# Patient Record
Sex: Female | Born: 1975 | Race: Black or African American | Hispanic: No | Marital: Married | Smoking: Former smoker
Health system: Southern US, Community
[De-identification: ages and names within clinical notes are randomized; demographics above are authoritative.]

## PROBLEM LIST (undated history)

## (undated) DIAGNOSIS — N971 Female infertility of tubal origin: Secondary | ICD-10-CM

## (undated) DIAGNOSIS — D250 Submucous leiomyoma of uterus: Secondary | ICD-10-CM

---

## 2015-05-02 ENCOUNTER — Encounter (HOSPITAL_BASED_OUTPATIENT_CLINIC_OR_DEPARTMENT_OTHER): Payer: Medicaid Other | Admitting: Clinical

## 2015-05-02 ENCOUNTER — Encounter: Payer: Self-pay | Admitting: Family Medicine

## 2015-05-02 ENCOUNTER — Ambulatory Visit: Payer: Medicaid Other | Attending: Family Medicine | Admitting: Family Medicine

## 2015-05-02 VITALS — BP 145/84 | HR 89 | Temp 99.0°F | Resp 16 | Ht 64.0 in | Wt 176.0 lb

## 2015-05-02 DIAGNOSIS — R030 Elevated blood-pressure reading, without diagnosis of hypertension: Secondary | ICD-10-CM | POA: Insufficient documentation

## 2015-05-02 DIAGNOSIS — M654 Radial styloid tenosynovitis [de Quervain]: Secondary | ICD-10-CM | POA: Diagnosis not present

## 2015-05-02 DIAGNOSIS — Z0000 Encounter for general adult medical examination without abnormal findings: Secondary | ICD-10-CM | POA: Diagnosis present

## 2015-05-02 DIAGNOSIS — Z658 Other specified problems related to psychosocial circumstances: Secondary | ICD-10-CM

## 2015-05-02 DIAGNOSIS — IMO0001 Reserved for inherently not codable concepts without codable children: Secondary | ICD-10-CM

## 2015-05-02 DIAGNOSIS — Z319 Encounter for procreative management, unspecified: Secondary | ICD-10-CM | POA: Diagnosis not present

## 2015-05-02 LAB — POCT URINALYSIS DIPSTICK
Bilirubin, UA: NEGATIVE
Blood, UA: NEGATIVE
Glucose, UA: NEGATIVE
Ketones, UA: NEGATIVE
LEUKOCYTES UA: NEGATIVE
Nitrite, UA: NEGATIVE
PH UA: 6
PROTEIN UA: NEGATIVE
SPEC GRAV UA: 1.025
UROBILINOGEN UA: 0.2

## 2015-05-02 LAB — POCT GLYCOSYLATED HEMOGLOBIN (HGB A1C): HEMOGLOBIN A1C: 5.6

## 2015-05-02 MED ORDER — FOLIC ACID 800 MCG PO TABS: 800.0000 ug | ORAL_TABLET | Freq: Every day | ORAL | Status: DC

## 2015-05-02 NOTE — Patient Instructions (Addendum)
Belinda Kaufman was seen today for annual exam.  Diagnoses and all orders for this visit:  Healthcare maintenance -     POCT urinalysis dipstick  Elevated BP -     Cancel: COMPLETE METABOLIC PANEL WITH GFR -     Cancel: CBC -     HgB A1c -     Cancel: Lipid Panel -     Cancel: TSH  De Quervain's tenosynovitis, right   I recommend pretreating with ibuprofen or aleve before a long work day. Take with food.   I will obtain labs results from [facility] and pap results from your previous PCP in [place].  F/u in 4 weeks for BP check with pharmacist  F/u with me in 3 months for BP check, sooner if needed   Dr. Bryson Corona Tenosynovitis Tendons attach muscles to bones. They also help with joint movements. When tendons become irritated or swollen, it is called tendinitis. The extensor pollicis brevis (EPB) tendon connects the EPB muscle to a bone that is near the base of the thumb. The EPB muscle helps to straighten and extend the thumb. De Quervain tenosynovitis is a condition in which the EPB tendon lining (sheath) becomes irritated, thickened, and swollen. This condition is sometimes called stenosing tenosynovitis. This condition causes pain on the thumb side of the back of the wrist. CAUSES Causes of this condition include:  Activities that repeatedly cause your thumb and wrist to extend.  A sudden increase in activity or change in activity that affects your wrist. RISK FACTORS: This condition is more likely to develop in:  Females.  People who have diabetes.  Women who have recently given birth.  People who are over 64 years of age.  People who do activities that involve repeated hand and wrist motions, such as tennis, racquetball, volleyball, gardening, and taking care of children.  People who do heavy labor.  People who have poor wrist strength and flexibility.  People who do not warm up properly before activities. SYMPTOMS Symptoms of this condition  include:  Pain or tenderness over the thumb side of the back of the wrist when your thumb and wrist are not moving.  Pain that gets worse when you straighten your thumb or extend your thumb or wrist.  Pain when the injured area is touched.  Locking or catching of the thumb joint while you bend and straighten your thumb.  Decreased thumb motion due to pain.  Swelling over the affected area. DIAGNOSIS This condition is diagnosed with a medical history and physical exam. Your health care provider will ask for details about your injury and ask about your symptoms. TREATMENT Treatment may include the use of icing and medicines to reduce pain and swelling. You may also be advised to wear a splint or brace to limit your thumb and wrist motion. In less severe cases, treatment may also include working with a physical therapist to strengthen your wrist and calm the irritation around your EPB tendon sheath. In severe cases, surgery may be needed. HOME CARE INSTRUCTIONS If You Have a Splint or Brace:  Wear it as told by your health care provider. Remove it only as told by your health care provider.  Loosen the splint or brace if your fingers become numb and tingle, or if they turn cold and blue.  Keep the splint or brace clean and dry. Managing Pain, Stiffness, and Swelling   If directed, apply ice to the injured area.  Put ice in a  plastic bag.  Place a towel between your skin and the bag.  Leave the ice on for 20 minutes, 2-3 times per day.  Move your fingers often to avoid stiffness and to lessen swelling.  Raise (elevate) the injured area above the level of your heart while you are sitting or lying down. General Instructions  Return to your normal activities as told by your health care provider. Ask your health care provider what activities are safe for you.  Take over-the-counter and prescription medicines only as told by your health care provider.  Keep all follow-up visits as  told by your health care provider. This is important.  Do not drive or operate heavy machinery while taking prescription pain medicine. SEEK MEDICAL CARE IF:  Your pain, tenderness, or swelling gets worse, even if you have had treatment.  You have numbness or tingling in your wrist, hand, or fingers on the injured side.   This information is not intended to replace advice given to you by your health care provider. Make sure you discuss any questions you have with your health care provider.   Document Released: 04/21/2005 Document Revised: 01/10/2015 Document Reviewed: 06/27/2014 Elsevier Interactive Patient Education [employer].

## 2015-05-02 NOTE — Progress Notes (Signed)
Annual physical  Last pap smear on 2015, normal results Tobacco user No Pain today  No suicidal thought in the past two weeks

## 2015-05-02 NOTE — Assessment & Plan Note (Signed)
A: elevated BP x one. Recent stressors P: Patient referred to [facility] f/u for recheck Plan to start norvasc 5 at f/u if still HTN

## 2015-05-02 NOTE — Progress Notes (Signed)
Patient ID: Belinda Kaufman, female   DOB: 05-Apr-1976, 39 y.o.   MRN: [id number]  Subjective:  Patient ID: Belinda Kaufman, female    DOB: [birth year]  Age: 39 y.o. MRN: [id number]  CC: Annual Exam   HPI Rafeef Kornbluth presents for    Wellness visit. No acute complaints or concerns. Hoping to conceive in near future.   History reviewed. No pertinent past medical history.  OB History    Gravida Para Term Preterm AB TAB SAB Ectopic Multiple Living   4 1 1  3     1       Past Surgical History  Procedure Laterality Date  . Cesarean section  10/12/2002    Family History  Problem Relation Age of Onset  . Hypertension Mother   . Diabetes Father   . Kidney disease Father   . COPD Father   . Kidney disease Son   . Cancer Maternal Grandmother   . Diabetes Paternal Grandmother     Social History  Substance Use Topics  . Smoking status: Never Smoker   . Smokeless tobacco: Not on file  . Alcohol Use: 0.0 oz/week    0 Standard drinks or equivalent per week     Comment: OCC    ROS Review of Systems  Constitutional: Negative for fever and chills.  Eyes: Negative for visual disturbance.  Respiratory: Negative for shortness of breath.   Cardiovascular: Negative for chest pain.  Gastrointestinal: Negative for abdominal pain and blood in stool.  Musculoskeletal: Positive for arthralgias (R medial wrist ). Negative for back pain.  Skin: Negative for rash.  Allergic/Immunologic: Negative for immunocompromised state.  Hematological: Negative for adenopathy. Does not bruise/bleed easily.  Psychiatric/Behavioral: Negative for suicidal ideas and dysphoric mood. The patient is nervous/anxious.     Objective:   Today's Vitals: BP 145/84 mmHg  Pulse 89  Temp(Src) 99 F (37.2 C) (Oral)  Resp 16  Ht 5\' 4"  (1.626 m)  Wt 176 lb (79.833 kg)  BMI 30.20 kg/m2  SpO2 100%  LMP 04/22/2015  Physical Exam  Constitutional: She is oriented to person, place, and time. She appears  well-developed and well-nourished. No distress.  HENT:  Head: Normocephalic and atraumatic.  Cardiovascular: Normal rate, regular rhythm, normal heart sounds and intact distal pulses.   Pulmonary/Chest: Effort normal and breath sounds normal. No respiratory distress. Chest wall is not dull to percussion. She exhibits no mass, no tenderness and no bony tenderness. Right breast exhibits no inverted nipple, no mass, no nipple discharge, no skin change and no tenderness. Left breast exhibits no inverted nipple, no mass, no nipple discharge, no skin change and no tenderness. Breasts are symmetrical.  Abdominal: Soft. Bowel sounds are normal. She exhibits no distension. There is no tenderness. There is no rebound and no guarding.  Musculoskeletal: She exhibits no edema.  Neurological: She is alert and oriented to person, place, and time.  Skin: Skin is warm and dry. No rash noted.  Psychiatric: She has a normal mood and affect.   Depression screen PHQ 2/9 05/02/2015  Decreased Interest 1  Down, Depressed, Hopeless 2  PHQ - 2 Score 3  Altered sleeping 2  Tired, decreased energy 2  Change in appetite 0  Feeling bad or failure about yourself  2  Trouble concentrating 0  Moving slowly or fidgety/restless 0  Suicidal thoughts 0  PHQ-9 Score 9   GAD 7 : Generalized Anxiety Score 05/02/2015  Nervous, Anxious, on Edge 1  Control/stop worrying 1  Worry too  much - different things 1  Trouble relaxing 0  Restless 0  Easily annoyed or irritable 2  Afraid - awful might happen 0  Total GAD 7 Score 5     Assessment & Plan:   Problem List Items Addressed This Visit    De Quervain's tenosynovitis, right   Elevated BP   Relevant Orders   HgB A1c (Completed)    Other Visit Diagnoses    Healthcare maintenance    -  Primary    Relevant Orders    POCT urinalysis dipstick (Completed)    Patient desires pregnancy        Relevant Medications    folic acid (FOLVITE) Q000111Q MCG tablet       No  outpatient encounter prescriptions on file as of 05/02/2015.   No facility-administered encounter medications on file as of 05/02/2015.    Follow-up: No Follow-up on file.    Boykin Nearing MD

## 2015-05-02 NOTE — Progress Notes (Signed)
ASSESSMENT: Pt currently experiencing psychosocial stressors, needs to f/u with PCP and [facility]; would benefit from psychoeducation and supportive counseling regarding coping with symptoms of depression and grief, as well as community resources,  and may benefit from specialized grief counseling.  Stage of Change: contemplative  PLAN: 1. F/U with behavioral health consultant in one month, or earlier as needed 2. Psychiatric Medications: none. 3. Behavioral recommendation(s):   -Consider self-care as important to health and wellbeing  -Consider group grief counseling via hospice services -Consider reading educational material regarding coping with symptoms of depression and grief SUBJECTIVE: Pt. referred by Dr Adrian Blackwater for grief:  Pt. reports the following symptoms/concerns: Pt states she has gone through many life changes in past couple of years, including move from [place] to [place], followed by loss of her father, and trying to have a baby, and that she has not had time to adjust to all of the life stress. She has been thinking that talking to someone would help, so that she does not burden her husband and 5yo son with her problems.  Duration of problem: At least 3 months Severity: mild  OBJECTIVE: Orientation & Cognition: Oriented x3. Thought processes normal and appropriate to situation. Mood: teary. Affect: appropriate Appearance: appropriate Risk of harm to self or others: no known risk of harm to self or others Substance use: none Assessments administered: PHQ9: 9/ GAD7: 5  Diagnosis: Psychosocial stressors CPT Code: Z65.8 -------------------------------------------- Other(s) present in the room: none  Time spent with patient in exam room: 20 minutes

## 2015-11-19 ENCOUNTER — Emergency Department (HOSPITAL_COMMUNITY): Payer: Commercial Managed Care - PPO

## 2015-11-19 ENCOUNTER — Emergency Department (HOSPITAL_COMMUNITY)
Admission: EM | Admit: 2015-11-19 | Discharge: 2015-11-19 | Disposition: A | Discharge status: Home / Self Care | Payer: Commercial Managed Care - PPO | Attending: Emergency Medicine | Admitting: Emergency Medicine

## 2015-11-19 ENCOUNTER — Encounter (HOSPITAL_COMMUNITY): Payer: Self-pay | Admitting: Emergency Medicine

## 2015-11-19 DIAGNOSIS — M25552 Pain in left hip: Secondary | ICD-10-CM | POA: Diagnosis not present

## 2015-11-19 DIAGNOSIS — M25562 Pain in left knee: Secondary | ICD-10-CM

## 2015-11-19 DIAGNOSIS — R200 Anesthesia of skin: Secondary | ICD-10-CM | POA: Insufficient documentation

## 2015-11-19 DIAGNOSIS — Z79899 Other long term (current) drug therapy: Secondary | ICD-10-CM | POA: Diagnosis not present

## 2015-11-19 MED ORDER — TRAMADOL HCL 50 MG PO TABS: 50.0000 mg | ORAL_TABLET | Freq: Four times a day (QID) | ORAL | Status: DC | PRN

## 2015-11-19 MED ORDER — CYCLOBENZAPRINE HCL 5 MG PO TABS: 5.0000 mg | ORAL_TABLET | Freq: Three times a day (TID) | ORAL | Status: DC | PRN

## 2015-11-19 NOTE — ED Provider Notes (Signed)
CSN: [id number]     Arrival date & time 11/19/15  1837 History  By signing my name below, I, Belinda Kaufman, attest that this documentation has been prepared under the direction and in the presence of Maura Braaten L. Bjorn Loser, PA-C Electronically Signed: Soijett Kaufman, ED Scribe. 11/19/2015. 8:59 PM.   Chief Complaint  Patient presents with  . Hip Pain  . Knee Pain      The history is provided by the patient. No language interpreter was used.    Belinda Kaufman is a 40 y.o. female who presents to the Emergency Department complaining of left hip pain onset 5 weeks. Pt reports that prior to the onset of her symptoms, she was working out and had left back and hip pain following. Pt states that she was initially dx 5 weeks ago with sciatica at an urgent care. Pt notes that when she didn't have any relief, she went to see her PCP and per pt informed that she may have nerve damage and not sciatica. Pt notes that her left hip pain radiates to her left knee and anterior left shin. She describes the pain as a cramping sensation to her left lateral thigh and left lateral knee. Pt left hip pain is worsened with ambulation and weight bearing. Denies any alleviating factors. Denies going to orthopedic for her symptoms. Pt is having associated symptoms of tingling and numbness to left anterior shin only and describes a tightness sensation to left shin. She notes that she has tried ice, icy-hot, robaxin, with minimal relief of her symptoms. She does not have back pain currently. She denies color change, wound, rash, weakness, warmth, calf swelling, bowel/bladder incontinence, saddle paresthesia, fever, cough, CP, or SOB. Pt denies recent surgery or immobilization. She denies h/o cancer. No OCP use. No ho blood clots. No hemoptysis.   Review of records indicated patient was seen on 10/30/15 by PCP for similar sxs that became exacerbated following road trip to [place]. Dx with sciatica; was given steroids in office, RX muscle  relaxer, and at home exercises.    History reviewed. No pertinent past medical history. Past Surgical History  Procedure Laterality Date  . Cesarean section  10/12/2002   Family History  Problem Relation Age of Onset  . Hypertension Mother   . Diabetes Father   . Kidney disease Father   . COPD Father   . Kidney disease Son   . Cancer Maternal Grandmother   . Diabetes Paternal Grandmother    Social History  Substance Use Topics  . Smoking status: Never Smoker   . Smokeless tobacco: Never Used  . Alcohol Use: 0.0 oz/week    0 Standard drinks or equivalent per week     Comment: OCC   OB History    Gravida Para Term Preterm AB TAB SAB Ectopic Multiple Living   4 1 1  3     1      Review of Systems  Constitutional: Negative for fever.  Respiratory: Negative for cough and shortness of breath.   Cardiovascular: Negative for chest pain.  Gastrointestinal:       No bowel incontinence.  Genitourinary:       No bladder incontinence.  Musculoskeletal: Positive for joint swelling ( left knee) and arthralgias (left hip and left knee). Negative for back pain and gait problem.  Skin: Negative for color change, rash and wound.  Neurological: Positive for numbness (left shin). Negative for weakness.       Tingling to left shin  Allergies  Review of patient's allergies indicates no known allergies.  Home Medications   Prior to Admission medications   Medication Sig Start Date End Date Taking? Authorizing Provider  cyclobenzaprine (FLEXERIL) 5 MG tablet Take 1 tablet (5 mg total) by mouth 3 (three) times daily as needed for muscle spasms. 11/19/15   Roxanna Mew, PA-C  folic acid (FOLVITE) Q000111Q MCG tablet Take 1 tablet (800 mcg total) by mouth daily. 05/02/15   Josalyn Funches, MD  traMADol (ULTRAM) 50 MG tablet Take 1 tablet (50 mg total) by mouth every 6 (six) hours as needed for severe pain. 11/19/15   Roxanna Mew, PA-C   BP 140/90 mmHg  Pulse 86  Temp(Src) 97.9  F (36.6 C) (Oral)  Resp 20  Ht 5\' 4"  (1.626 m)  Wt 79.379 kg  BMI 30.02 kg/m2  SpO2 100%  LMP 10/27/2015 Physical Exam  Constitutional: She appears well-developed and well-nourished. No distress.  HENT:  Head: Normocephalic and atraumatic.  Eyes: EOM are normal.  Neck: Neck supple.  Cardiovascular: Normal rate, regular rhythm, normal heart sounds and intact distal pulses.  Exam reveals no gallop and no friction rub.   No murmur heard. Pulmonary/Chest: Effort normal and breath sounds normal. No respiratory distress. She has no wheezes. She has no rales.  Abdominal: Soft. Bowel sounds are normal. She exhibits no distension. There is no tenderness.  Musculoskeletal: Normal range of motion. She exhibits no edema.  TTP of left greater trochanter and left lateral knee. Strength and ROM intact. Able to ambulate however favors left side. No laxity of ACL/PCL/MCL/LCL noted. Patella stable.  No unilateral leg swelling. No calf tenderness. No palpable cords. Compartment soft. Decreased sensation to front of left shin. Pulses 2+ b/l.   Neurological: She is alert.  Mental Status:  Alert, thought content appropriate, able to give a coherent history. Speech fluent without evidence of aphasia. Able to follow 2 step commands without difficulty.  Cranial Nerves:  II:  Peripheral visual fields grossly normal, pupils equal, round, reactive to light III,IV, VI: ptosis not present, extra-ocular motions intact bilaterally  V,VII: smile symmetric, facial light touch sensation equal VIII: hearing grossly normal to voice  X: uvula elevates symmetrically  XI: bilateral shoulder shrug symmetric and strong XII: midline tongue extension without fassiculations Motor:  Normal tone. 5/5 in upper and lower extremities bilaterally including strong and equal grip strength and dorsiflexion/plantar flexion Sensory: Decrease sensation in anterior lower extremity from below knee to mid shin, sensation elsewhere  intact. Cerebellar: normal finger-to-nose with bilateral upper extremities Gait: patient is able to ambulate favoring left side CV: distal pulses palpable throughout   Skin: Skin is warm and dry.  Psychiatric: She has a normal mood and affect. Her behavior is normal.  Nursing note and vitals reviewed.   ED Course  Procedures (including critical care time) DIAGNOSTIC STUDIES: Oxygen Saturation is 96% on RA, nl by my interpretation.    COORDINATION OF CARE: 8:59 PM Discussed treatment plan with pt at bedside which includes left knee xray, left hip unilateral with pelvis, and referral and follow up with orthopedist, and pt agreed to plan.   Imaging Review Dg Knee Complete 4 Views Left  11/19/2015  CLINICAL DATA:  Pain without trauma EXAM: LEFT KNEE - COMPLETE 4+ VIEW COMPARISON:  None. FINDINGS: No evidence of fracture, dislocation, or joint effusion. No evidence of arthropathy or other focal bone abnormality. Soft tissues are unremarkable. IMPRESSION: Negative. Electronically Signed   By: Dorise Bullion III M.D  On: 11/19/2015 21:33   Dg Hip Unilat With Pelvis 2-3 Views Left  11/19/2015  CLINICAL DATA:  Pain without trauma EXAM: DG HIP (WITH OR WITHOUT PELVIS) 2-3V LEFT COMPARISON:  None. FINDINGS: There is no evidence of hip fracture or dislocation. There is no evidence of arthropathy or other focal bone abnormality. IMPRESSION: Negative. Electronically Signed   By: Dorise Bullion III M.D   On: 11/19/2015 21:33   I have personally reviewed and evaluated these images as part of my medical decision-making. Filed Vitals:   11/19/15 1845 11/19/15 2243  BP: 144/90 140/90  Pulse: 71 86  Temp: 98.7 F (37.1 C) 97.9 F (36.6 C)  TempSrc: Oral Oral  Resp: 18 20  Height: 5\' 4"  (1.626 m)   Weight: 79.379 kg   SpO2: 96% 100%     MDM   Final diagnoses:  Left knee pain  Hip pain, left    Patient is afebrile and non-toxic appearing in NAD. Vital signs show elevated blood pressure;  otherwise stable. Physical exam remarkable for TTP of left greater trochanter and left lateral knee. Decrease sensation in anterior shin from below knee to mid shin. ROM intact. Patient able to ambulate. Patient was seen by her PCP on 10/30/15 for similar sxs and dx with sciatica; steroids given in office, Rx muscle relaxer and at home exercises.    No warmth, erythema, or swelling - low suspicion for septic or gouty joint. X-rays negative for acute abnormality. No unilateral leg swelling or TTP of posterior calf, no palpable cords, compartment soft; well's DVT score -2, low suspicion for DVT. Suspect MSK in nature ?secondary to new onset of exercise. Conservative tx discussed to include RICE and pain medication. Knee sleeve provided. Rx pain medication and muscle relaxer. Referral to orthopedics. Encouraged follow up with PCP. Return precautions discussed. Pt voiced understanding and is agreeable.   I personally performed the services described in this documentation, which was scribed in my presence. The recorded information has been reviewed and is accurate.    Roxanna Mew, PA-C 11/20/15 2006   Blanchie Dessert, MD 11/25/15 2125

## 2015-11-19 NOTE — Progress Notes (Addendum)
Pt c/o flare up of pain in her left hip radiating to her left knee. Pt is tearful crying she is going through a divorce. Pt c/o left calf pain and was in the car for a long time driving to and from [place] (June 17th) Pain started June 8th. Pt was seen at Urgent Care June 10th for the pain,pt states the pain has now shifted into her hip joint. Pt c/o also  left knee pain. 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 Pt state she is on her period .PA aware and stated no urine needed. Pt signed a waiver not to have a urine pregnancy test. (9:10pm)Pt s/p x-rays -tolerated well. (9:30pm)

## 2015-11-19 NOTE — Discharge Instructions (Signed)
Read the information below.   Your x-rays were normal. Continue to take ibuprofen or tylenol. I have prescribed tramadol, you can take for severe pain. I have also prescribed a muscle relaxer. These can make you drowsy do not drive after taking. I have provided a knee sleeve. Wear for extra added stability and support. When not on feet elevate your knee and apply ice.  Use the prescribed medication as directed.  Please discuss all new medications with your pharmacist.   I have provided the contact information for orthopedics. Please call and schedule a follow up appointment.  You may return to the Emergency Department at any time for worsening condition or any new symptoms that concern you. Return to ED if you develop fever, inability to walk, joint redness/warmth, unilateral leg swelling or pain in calf.     Joint Pain Joint pain can be caused by many things. The joint can be bruised, infected, weak from aging, or sore from exercise. The pain will probably go away if you follow your doctor's instructions for home care. If your joint pain continues, more tests may be needed to help find the cause of your condition. HOME CARE Watch your condition for any changes. Follow these instructions as told to lessen the pain that you are feeling:  Take medicines only as told by your doctor.  Rest the sore joint for as long as told by your doctor. If your doctor tells you to, raise (elevate) the painful joint above the level of your heart while you are sitting or lying down.  Do not do things that cause pain or make the pain worse.  If told, put ice on the painful area:  Put ice in a plastic bag.  Place a towel between your skin and the bag.  Leave the ice on for 20 minutes, 2-3 times per day.  Wear an elastic bandage, splint, or sling as told by your doctor. Loosen the bandage or splint if your fingers or toes lose feeling (become numb) and tingle, or if they turn cold and blue.  Begin exercising  or stretching the joint as told by your doctor. Ask your doctor what types of exercise are safe for you.  Keep all follow-up visits as told by your doctor. This is important. GET HELP IF:  Your pain gets worse and medicine does not help it.  Your joint pain does not get better in 3 days.  You have more bruising or swelling.  You have a fever.  You lose 10 pounds (4.5 kg) or more without trying. GET HELP RIGHT AWAY IF:  You are not able to move the joint.  Your fingers or toes become numb or they turn cold and blue.   This information is not intended to replace advice given to you by your health care provider. Make sure you discuss any questions you have with your health care provider.   Document Released: 04/09/2009 Document Revised: 05/12/2014 Document Reviewed: 01/31/2014 Elsevier Interactive Patient Education 2016 Elsevier Inc.  Musculoskeletal Pain Musculoskeletal pain is muscle and boney aches and pains. These pains can occur in any part of the body. Your caregiver may treat you without knowing the cause of the pain. They may treat you if blood or urine tests, X-rays, and other tests were normal.  CAUSES There is often not a definite cause or reason for these pains. These pains may be caused by a type of germ (virus). The discomfort may also come from overuse. Overuse includes working out  too hard when your body is not fit. Boney aches also come from weather changes. Bone is sensitive to atmospheric pressure changes. HOME CARE INSTRUCTIONS   Ask when your test results will be ready. Make sure you get your test results.  Only take over-the-counter or prescription medicines for pain, discomfort, or fever as directed by your caregiver. If you were given medications for your condition, do not drive, operate machinery or power tools, or sign legal documents for 24 hours. Do not drink alcohol. Do not take sleeping pills or other medications that may interfere with  treatment.  Continue all activities unless the activities cause more pain. When the pain lessens, slowly resume normal activities. Gradually increase the intensity and duration of the activities or exercise.  During periods of severe pain, bed rest may be helpful. Lay or sit in any position that is comfortable.  Putting ice on the injured area.  Put ice in a bag.  Place a towel between your skin and the bag.  Leave the ice on for 15 to 20 minutes, 3 to 4 times a day.  Follow up with your caregiver for continued problems and no reason can be found for the pain. If the pain becomes worse or does not go away, it may be necessary to repeat tests or do additional testing. Your caregiver may need to look further for a possible cause. SEEK IMMEDIATE MEDICAL CARE IF:  You have pain that is getting worse and is not relieved by medications.  You develop chest pain that is associated with shortness or breath, sweating, feeling sick to your stomach (nauseous), or throw up (vomit).  Your pain becomes localized to the abdomen.  You develop any new symptoms that seem different or that concern you. MAKE SURE YOU:   Understand these instructions.  Will watch your condition.  Will get help right away if you are not doing well or get worse.   This information is not intended to replace advice given to you by your health care provider. Make sure you discuss any questions you have with your health care provider.   Document Released: 04/21/2005 Document Revised: 07/14/2011 Document Reviewed: 12/24/2012 Elsevier Interactive Patient Education [employer].

## 2015-11-19 NOTE — ED Notes (Signed)
Patient presents for left hip pain radiating to left knee x1 month. Reports she was working out when pain started, thought she might've pulled something but pain hasn't gotten better. Denies numbness/tingling, no swelling. Extremity is warm to touch, color appropriate for ethnicity.

## 2016-01-10 NOTE — H&P (Signed)
Belinda Kaufman is a 40 y.o. female , originally referred to me by Dr. Lucita Lora, for submucosal myoma. By recent HSG she was diagnosed with a 1.4cm submucosal myoma (vs. polyp) and left proximal tubal occlusion. Patient would like to preserve her childbearing potential.  Pertinent Gynecological History: Menses: flow is excessive with use of 3 pads or tampons on heaviest days Bleeding: dysfunctional uterine bleeding Contraception: none DES exposure: denies Blood transfusions: none Sexually transmitted diseases: no past history Previous GYN Procedures: C-Section   Last mammogram: normal Last pap: normal  OB History: G4, P1, TOP 3   Menstrual History: Menarche age: 41 No LMP recorded.    No past medical history on file.                  Past Surgical History:  Procedure Laterality Date  . CESAREAN SECTION  10/12/2002             Family History  Problem Relation Age of Onset  . Hypertension Mother   . Diabetes Father   . Kidney disease Father   . COPD Father   . Kidney disease Son   . Cancer Maternal Grandmother   . Diabetes Paternal Grandmother    No hereditary disease.  No cancer of breast, ovary, uterus. No cutaneous leiomyomatosis or renal cell carcinoma.  Social History   Social History  . Marital status: Married    Spouse name: N/A  . Number of children: 1  . Years of education: college    Occupational History  . 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 Smoking status: Never Smoker  . Smokeless tobacco: Never Used  . Alcohol use 0.0 oz/week     Comment: OCC  . Drug use: No  . Sexual activity: Yes   Other Topics Concern  . Not on file   Social History Narrative   Moved from [place] to [place] in 12/2014 with husband an 45 year son.     No Known Allergies  No current facility-administered medications on file prior to encounter.    Current Outpatient Prescriptions on File Prior to Encounter   Medication Sig Dispense Refill  . cyclobenzaprine (FLEXERIL) 5 MG tablet Take 1 tablet (5 mg total) by mouth 3 (three) times daily as needed for muscle spasms. 15 tablet 0  . folic acid (FOLVITE) [id number] MCG tablet Take 1 tablet (800 mcg total) by mouth daily. 30 tablet 5  . traMADol (ULTRAM) 50 MG tablet Take 1 tablet (50 mg total) by mouth every 6 (six) hours as needed for severe pain. 12 tablet 0     Review of Systems  Constitutional: Negative.   HENT: Negative.   Eyes: Negative.   Respiratory: Negative.   Cardiovascular: Negative.   Gastrointestinal: Negative.   Genitourinary: Negative.   Musculoskeletal: Negative.   Skin: Negative.   Neurological: Negative.   Endo/Heme/Allergies: Negative.   Psychiatric/Behavioral: Negative.      Physical Exam  There were no vitals taken for this visit. Constitutional: She is oriented to person, place, and time. She appears well-developed and well-nourished.  HENT:  Head: Normocephalic and atraumatic.  Nose: Nose normal.  Mouth/Throat: Oropharynx is clear and moist. No oropharyngeal exudate.  Eyes: Conjunctivae normal and EOM are normal. Pupils are equal, round, and reactive to light. No scleral icterus.  Neck: Normal range of motion. Neck supple. No tracheal deviation present. No thyromegaly present.  Cardiovascular: Normal rate.   Respiratory:  Effort normal and breath sounds normal.  GI: Soft. Bowel sounds are normal. She exhibits no distension and no mass. There is no tenderness.  Lymphadenopathy:    She has no cervical adenopathy.  Neurological: She is alert and oriented to person, place, and time. She has normal reflexes.  Skin: Skin is warm.  Psychiatric: She has a normal mood and affect. Her behavior is normal. Judgment and thought content normal.       Assessment/Plan:  I counseled her anout H/S, resection of myoma. She also requests Novy catherization of the left proximal tube

## 2016-02-07 ENCOUNTER — Encounter (HOSPITAL_BASED_OUTPATIENT_CLINIC_OR_DEPARTMENT_OTHER): Payer: Self-pay | Admitting: *Deleted

## 2016-02-11 ENCOUNTER — Encounter (HOSPITAL_BASED_OUTPATIENT_CLINIC_OR_DEPARTMENT_OTHER): Payer: Self-pay | Admitting: *Deleted

## 2016-02-11 NOTE — Progress Notes (Signed)
NPO AFTER MN WITH EXCEPTION CLEAR LIQUIDS UNTIL 0700 (NO CREAM/ MILK PRODUCTS).  ARRIVE AT 1145. NEEDS HG AND URINE PREG. 

## 2016-02-12 ENCOUNTER — Ambulatory Visit (HOSPITAL_BASED_OUTPATIENT_CLINIC_OR_DEPARTMENT_OTHER)
Admission: RE | Admit: 2016-02-12 | Discharge: 2016-02-12 | Disposition: A | Discharge status: Home / Self Care | Payer: Commercial Managed Care - PPO | Source: Ambulatory Visit | Attending: Obstetrics and Gynecology | Admitting: Obstetrics and Gynecology

## 2016-02-12 ENCOUNTER — Encounter (HOSPITAL_BASED_OUTPATIENT_CLINIC_OR_DEPARTMENT_OTHER): Payer: Self-pay | Admitting: *Deleted

## 2016-02-12 ENCOUNTER — Ambulatory Visit (HOSPITAL_BASED_OUTPATIENT_CLINIC_OR_DEPARTMENT_OTHER): Payer: Commercial Managed Care - PPO | Admitting: Anesthesiology

## 2016-02-12 ENCOUNTER — Encounter (HOSPITAL_BASED_OUTPATIENT_CLINIC_OR_DEPARTMENT_OTHER)
Admission: RE | Disposition: A | Discharge status: Home / Self Care | Payer: Self-pay | Source: Ambulatory Visit | Attending: Obstetrics and Gynecology

## 2016-02-12 ENCOUNTER — Ambulatory Visit (HOSPITAL_COMMUNITY): Payer: Commercial Managed Care - PPO

## 2016-02-12 DIAGNOSIS — D250 Submucous leiomyoma of uterus: Secondary | ICD-10-CM | POA: Insufficient documentation

## 2016-02-12 DIAGNOSIS — D649 Anemia, unspecified: Secondary | ICD-10-CM | POA: Diagnosis not present

## 2016-02-12 DIAGNOSIS — N920 Excessive and frequent menstruation with regular cycle: Secondary | ICD-10-CM | POA: Diagnosis not present

## 2016-02-12 DIAGNOSIS — N888 Other specified noninflammatory disorders of cervix uteri: Secondary | ICD-10-CM | POA: Insufficient documentation

## 2016-02-12 DIAGNOSIS — Z87891 Personal history of nicotine dependence: Secondary | ICD-10-CM | POA: Insufficient documentation

## 2016-02-12 DIAGNOSIS — N979 Female infertility, unspecified: Secondary | ICD-10-CM | POA: Diagnosis not present

## 2016-02-12 HISTORY — DX: Female infertility of tubal origin: N97.1

## 2016-02-12 HISTORY — DX: Submucous leiomyoma of uterus: D25.0

## 2016-02-12 HISTORY — PX: HYSTEROSCOPY: SHX211

## 2016-02-12 LAB — POCT HEMOGLOBIN-HEMACUE: HEMOGLOBIN: 7 g/dL — AB (ref 12.0–15.0)

## 2016-02-12 LAB — POCT PREGNANCY, URINE: PREG TEST UR: NEGATIVE

## 2016-02-12 SURGERY — HYSTEROSCOPY
Anesthesia: General | Site: Vagina

## 2016-02-12 MED ORDER — ONDANSETRON HCL 4 MG/2ML IJ SOLN
INTRAMUSCULAR | Status: AC
  Filled 2016-02-12: qty 2

## 2016-02-12 MED ORDER — VASOPRESSIN 20 UNIT/ML IV SOLN
INTRAVENOUS | Status: DC | PRN
  Administered 2016-02-12: 20 [IU]

## 2016-02-12 MED ORDER — LIDOCAINE 2% (20 MG/ML) 5 ML SYRINGE
INTRAMUSCULAR | Status: AC
  Filled 2016-02-12: qty 5

## 2016-02-12 MED ORDER — GLYCOPYRROLATE 0.2 MG/ML IV SOSY
PREFILLED_SYRINGE | INTRAVENOUS | Status: DC | PRN
  Administered 2016-02-12 (×2): .2 mg via INTRAVENOUS

## 2016-02-12 MED ORDER — HYDROMORPHONE HCL 1 MG/ML IJ SOLN
0.2500 mg | INTRAMUSCULAR | Status: DC | PRN
  Filled 2016-02-12: qty 1

## 2016-02-12 MED ORDER — SODIUM CHLORIDE 0.9 % IJ SOLN
INTRAMUSCULAR | Status: DC | PRN
  Administered 2016-02-12: 60 mL

## 2016-02-12 MED ORDER — MEPERIDINE HCL 25 MG/ML IJ SOLN
6.2500 mg | INTRAMUSCULAR | Status: DC | PRN
  Filled 2016-02-12: qty 1

## 2016-02-12 MED ORDER — PROMETHAZINE HCL 25 MG/ML IJ SOLN
6.2500 mg | INTRAMUSCULAR | Status: DC | PRN
  Filled 2016-02-12: qty 1

## 2016-02-12 MED ORDER — LACTATED RINGERS IV SOLN
INTRAVENOUS | Status: DC
  Filled 2016-02-12: qty 1000

## 2016-02-12 MED ORDER — MIDAZOLAM HCL 2 MG/2ML IJ SOLN
INTRAMUSCULAR | Status: AC
Start: 2016-02-12 — End: 2016-02-12
  Filled 2016-02-12: qty 2

## 2016-02-12 MED ORDER — FENTANYL CITRATE (PF) 100 MCG/2ML IJ SOLN
INTRAMUSCULAR | Status: AC
Start: 2016-02-12 — End: 2016-02-12
  Filled 2016-02-12: qty 2

## 2016-02-12 MED ORDER — CEFAZOLIN SODIUM-DEXTROSE 2-4 GM/100ML-% IV SOLN
2.0000 g | INTRAVENOUS | Status: AC
  Administered 2016-02-12: 2 g via INTRAVENOUS
  Filled 2016-02-12: qty 100

## 2016-02-12 MED ORDER — KETOROLAC TROMETHAMINE 30 MG/ML IJ SOLN
INTRAMUSCULAR | Status: AC
  Filled 2016-02-12: qty 1

## 2016-02-12 MED ORDER — FENTANYL CITRATE (PF) 100 MCG/2ML IJ SOLN
INTRAMUSCULAR | Status: DC | PRN
  Administered 2016-02-12: 50 ug via INTRAVENOUS
  Administered 2016-02-12 (×2): 25 ug via INTRAVENOUS

## 2016-02-12 MED ORDER — PROPOFOL 10 MG/ML IV BOLUS
INTRAVENOUS | Status: AC
  Filled 2016-02-12: qty 20

## 2016-02-12 MED ORDER — MIDAZOLAM HCL 5 MG/5ML IJ SOLN
INTRAMUSCULAR | Status: DC | PRN
  Administered 2016-02-12: 2 mg via INTRAVENOUS

## 2016-02-12 MED ORDER — LACTATED RINGERS IV SOLN
INTRAVENOUS | Status: DC
  Administered 2016-02-12 (×2): via INTRAVENOUS
  Filled 2016-02-12: qty 1000

## 2016-02-12 MED ORDER — LIDOCAINE 2% (20 MG/ML) 5 ML SYRINGE
INTRAMUSCULAR | Status: DC | PRN
  Administered 2016-02-12: 100 mg via INTRAVENOUS

## 2016-02-12 MED ORDER — LIDOCAINE-PRILOCAINE 2.5-2.5 % EX CREA
TOPICAL_CREAM | CUTANEOUS | Status: AC
  Filled 2016-02-12: qty 5

## 2016-02-12 MED ORDER — DEXAMETHASONE SODIUM PHOSPHATE 4 MG/ML IJ SOLN
INTRAMUSCULAR | Status: DC | PRN
  Administered 2016-02-12: 10 mg via INTRAVENOUS

## 2016-02-12 MED ORDER — SODIUM CHLORIDE 0.9 % IR SOLN
Status: DC | PRN
  Administered 2016-02-12: 9000 mL

## 2016-02-12 MED ORDER — DEXAMETHASONE SODIUM PHOSPHATE 10 MG/ML IJ SOLN
INTRAMUSCULAR | Status: AC
  Filled 2016-02-12: qty 1

## 2016-02-12 MED ORDER — IOHEXOL 300 MG/ML  SOLN
INTRAMUSCULAR | Status: DC | PRN
  Administered 2016-02-12: 7 mL

## 2016-02-12 MED ORDER — PROPOFOL 10 MG/ML IV BOLUS
INTRAVENOUS | Status: DC | PRN
  Administered 2016-02-12: 200 mg via INTRAVENOUS

## 2016-02-12 MED ORDER — CEFAZOLIN SODIUM-DEXTROSE 2-4 GM/100ML-% IV SOLN
INTRAVENOUS | Status: AC
  Filled 2016-02-12: qty 100

## 2016-02-12 SURGICAL SUPPLY — 43 items
BIPOLAR CUTTING LOOP 21FR (ELECTRODE)
CANISTER SUCTION 2500CC (MISCELLANEOUS) ×3 IMPLANT
CANNULA CURETTE W/SYR 6 (CANNULA) IMPLANT
CANNULA CURETTE W/SYR 6MM (CANNULA)
CANNULA CURETTE W/SYR 7 (CANNULA) IMPLANT
CANNULA CURETTE W/SYR 7MM (CANNULA)
CATH HSG 5FRX28CM (CATHETERS) ×3 IMPLANT
CATH INTRA ACCESS BALLN (BALLOONS) IMPLANT
CATH ROBINSON RED A/P 16FR (CATHETERS) ×3 IMPLANT
CATH SALPINGOGRAPHY SELECT (BALLOONS) IMPLANT
CATH SSG INJECTION W/GUIDEWIRE (BALLOONS) IMPLANT
COVER BACK TABLE 60X90IN (DRAPES) ×3 IMPLANT
DRAPE LG THREE QUARTER DISP (DRAPES) ×3 IMPLANT
ELECT BIPOLAR POINTED 21FR (MISCELLANEOUS)
ELECT REM PT RETURN 9FT ADLT (ELECTROSURGICAL)
ELECTRODE LOOP CTNG BIPLR 21FR (MISCELLANEOUS) IMPLANT
ELECTRODE REM PT RTRN 9FT ADLT (ELECTROSURGICAL) IMPLANT
GLOVE BIO SURGEON STRL SZ8 (GLOVE) ×3 IMPLANT
GLOVE BIOGEL PI IND STRL 8.5 (GLOVE) ×1 IMPLANT
GLOVE BIOGEL PI INDICATOR 8.5 (GLOVE) ×2
GOWN STRL REUS W/ TWL XL LVL3 (GOWN DISPOSABLE) ×1 IMPLANT
GOWN STRL REUS W/TWL LRG LVL3 (GOWN DISPOSABLE) ×3 IMPLANT
GOWN STRL REUS W/TWL XL LVL3 (GOWN DISPOSABLE) ×2
IV NS IRRIG 3000ML ARTHROMATIC (IV SOLUTION) ×3 IMPLANT
KIT ROOM TURNOVER WOR (KITS) ×3 IMPLANT
LEGGING LITHOTOMY PAIR STRL (DRAPES) ×3 IMPLANT
LOOP CUT BIPOLAR 24F LRG (ELECTROSURGICAL) ×3 IMPLANT
LOOP CUTTING BIPOLAR 21FR (ELECTRODE) IMPLANT
MANIFOLD NEPTUNE II (INSTRUMENTS) IMPLANT
NEEDLE SPNL 22GX3.5 QUINCKE BK (NEEDLE) ×3 IMPLANT
PACK BASIN DAY SURGERY FS (CUSTOM PROCEDURE TRAY) ×3 IMPLANT
PAD OB MATERNITY 4.3X12.25 (PERSONAL CARE ITEMS) ×3 IMPLANT
SET IRRIG Y TYPE TUR BLADDER L (SET/KITS/TRAYS/PACK) ×3 IMPLANT
STENT BALLN UTERINE 4CM 6FR (STENTS) IMPLANT
SUT SILK 2 0 SH (SUTURE) IMPLANT
SUT SILK 3 0 PS 1 (SUTURE) IMPLANT
SYR 20CC LL (SYRINGE) IMPLANT
SYR 3ML 18GX1 1/2 (SYRINGE) ×3 IMPLANT
TOWEL OR 17X24 6PK STRL BLUE (TOWEL DISPOSABLE) ×6 IMPLANT
TRAY DSU PREP LF (CUSTOM PROCEDURE TRAY) ×3 IMPLANT
TUBE CONNECTING 12'X1/4 (SUCTIONS) ×1
TUBE CONNECTING 12X1/4 (SUCTIONS) ×2 IMPLANT
WATER STERILE IRR 500ML POUR (IV SOLUTION) ×3 IMPLANT

## 2016-02-12 NOTE — Anesthesia Procedure Notes (Signed)
Procedures

## 2016-02-12 NOTE — Discharge Instructions (Signed)

## 2016-02-12 NOTE — Transfer of Care (Signed)
Last Vitals:  Vitals:   02/12/16 1201 02/12/16 1700  BP: 140/69   Pulse: 66   Resp: 16   Temp: 37.1 C (P) 36.9 C    Last Pain:  Vitals:   02/12/16 1201  TempSrc: Oral      Patients Stated Pain Goal: 7 (02/12/16 1217) Immediate Anesthesia Transfer of Care Note  Patient: Belinda Kaufman  Procedure(s) Performed: Procedure(s) (LRB): HYSTEROSCOPY WITH SUBMUCOSAL MYOMA RESECTION,HSG (N/A)  Patient Location: PACU  Anesthesia Type: General  Level of Consciousness: awake, alert  and oriented  Airway & Oxygen Therapy: Patient Spontanous Breathing and Patient connected to nasal cannula oxygen  Post-op Assessment: Report given to PACU RN and Post -op Vital signs reviewed and stable  Post vital signs: Reviewed and stable  Complications: No apparent anesthesia complications

## 2016-02-12 NOTE — Anesthesia Procedure Notes (Signed)
Procedure Name: LMA Insertion Date/Time: 02/12/2016 4:10 PM Performed by: Nolon Nations Pre-anesthesia Checklist: Patient identified, Emergency Drugs available, Suction available and Patient being monitored Patient Re-evaluated:Patient Re-evaluated prior to inductionOxygen Delivery Method: Circle system utilized Preoxygenation: Pre-oxygenation with 100% oxygen Intubation Type: IV induction Ventilation: Mask ventilation without difficulty LMA: LMA inserted LMA Size: 4.0 Number of attempts: 1 Airway Equipment and Method: Bite block Placement Confirmation: positive ETCO2 Tube secured with: Tape Dental Injury: Teeth and Oropharynx as per pre-operative assessment

## 2016-02-12 NOTE — Anesthesia Postprocedure Evaluation (Signed)
Anesthesia Post Note  Patient: Belinda Kaufman  Procedure(s) Performed: Procedure(s) (LRB): HYSTEROSCOPY WITH SUBMUCOSAL MYOMA RESECTION,HSG (N/A)  Patient location during evaluation: PACU Anesthesia Type: General Level of consciousness: sedated and patient cooperative Pain management: pain level controlled Vital Signs Assessment: post-procedure vital signs reviewed and stable Respiratory status: spontaneous breathing Cardiovascular status: stable Anesthetic complications: no Comments: Pt with chronically low Hgb. Discussed with Dr. Kerin Perna. Pt asymptomatic and desires to go home.     Last Vitals:  Vitals:   02/12/16 1745 02/12/16 1845  BP: 138/86 136/85  Pulse: 74 66  Resp: 13 14  Temp:  37.2 C    Last Pain:  Vitals:   02/12/16 1201  TempSrc: Oral                 Nolon Nations

## 2016-02-12 NOTE — Anesthesia Preprocedure Evaluation (Addendum)
Anesthesia Evaluation  Patient identified by MRN, date of birth, ID band Patient awake    Reviewed: Allergy & Precautions, NPO status , Patient's Chart, lab work & pertinent test results  Airway Mallampati: II  TM Distance: >3 FB Neck ROM: Full    Dental  (+) Teeth Intact, Dental Advisory Given   Pulmonary former smoker,    breath sounds clear to auscultation       Cardiovascular negative cardio ROS   Rhythm:Regular Rate:Normal     Neuro/Psych negative neurological ROS  negative psych ROS   GI/Hepatic negative GI ROS, Neg liver ROS,   Endo/Other  negative endocrine ROS  Renal/GU negative Renal ROS  negative genitourinary   Musculoskeletal negative musculoskeletal ROS (+)   Abdominal   Peds negative pediatric ROS (+)  Hematology negative hematology ROS (+)   Anesthesia Other Findings   Reproductive/Obstetrics negative OB ROS                            Anesthesia Physical Anesthesia Plan  ASA: II  Anesthesia Plan: General   Post-op Pain Management:    Induction: Intravenous  Airway Management Planned: Oral ETT  Additional Equipment:   Intra-op Plan:   Post-operative Plan: Extubation in OR  Informed Consent: I have reviewed the patients History and Physical, chart, labs and discussed the procedure including the risks, benefits and alternatives for the proposed anesthesia with the patient or authorized representative who has indicated his/her understanding and acceptance.   Dental advisory given  Plan Discussed with: CRNA  Anesthesia Plan Comments:         Anesthesia Quick Evaluation

## 2016-02-13 ENCOUNTER — Encounter (HOSPITAL_BASED_OUTPATIENT_CLINIC_OR_DEPARTMENT_OTHER): Payer: Self-pay | Admitting: Obstetrics and Gynecology

## 2016-02-13 LAB — POCT HEMOGLOBIN-HEMACUE: Hemoglobin: 6.3 g/dL — CL (ref 12.0–15.0)

## 2016-02-20 NOTE — Op Note (Addendum)
OPERATIVE NOTE  Preoperative diagnosis: Submucosal myoma, rule out left tubal proximal occlusion, menorrhagia causing severe anemia, infertility  Postoperative diagnosis: Submucosal myoma, menorrhagia causing severe anemia, infertility  Procedure: Hysteroscopy, resection of submucosal myoma, intraop HSG  Surgeon: Governor Specking  Anesthesia: General  Complications: None  Estimated blood loss: Less than 20 mL  Specimen: myoma pieces to pathology  Findings: Endocervical canal appeared normal. The uterus sounded to 10 cm. Endometrial cavity had a 2 cm left posterior Type I submucosal myoma. Otherwise it was of normal appearance and normal configuration. Both tubal ostia were seen. HSG showed a filling defect due to the submucosal myoma, otherwise normal uterine size and configuration.  The left fallopian tube as well as the right fallopian tube opacified, and spelled.  There was no proximal tubal occlusion.  Description of procedure: Patient was placed in dorsal supine position. General anesthesia was administered. She was placed in lithotomy position. She was prepped and draped in sterile manner. A vaginal speculum was placed. A dilute vasopressin solution containing 0.33 units per milliliter was injected into the cervical stroma x5 cc. A Slimline hysteroscope with 30 lens was inserted into the canal and above findings were noted. Distention medium was normal saline. Distention method was a hysteroscopic pump set at 80 mm mercury. Above findings were noted.  We uses slimline bipolar resectoscope with a 22 French right angle loop electrode attached to it.  Using cutting current mode the type 1/2 posterior submucosal myoma was shaved off down to the level of the endometrium.  Then the intrauterine pressure was successively decreased and increased allowing the intramural portion of this myoma to be extruded.  Using this method, the entire myoma could be resected.  The myoma pieces were removed  with a sharp curette and submitted to pathology. Hemostasis was insured. Instrument count was correct. Estimated blood loss was less than 20 mL. The patient tolerated the procedure well and was transferred to recovery in satisfactory condition.  Governor Specking, MD

## 2016-07-19 ENCOUNTER — Emergency Department (HOSPITAL_COMMUNITY): Payer: Commercial Managed Care - PPO

## 2016-07-19 ENCOUNTER — Emergency Department (HOSPITAL_COMMUNITY)
Admission: EM | Admit: 2016-07-19 | Discharge: 2016-07-19 | Disposition: A | Discharge status: Home / Self Care | Payer: Commercial Managed Care - PPO | Attending: Emergency Medicine | Admitting: Emergency Medicine

## 2016-07-19 ENCOUNTER — Encounter (HOSPITAL_COMMUNITY): Payer: Self-pay | Admitting: Emergency Medicine

## 2016-07-19 DIAGNOSIS — E860 Dehydration: Secondary | ICD-10-CM

## 2016-07-19 DIAGNOSIS — R55 Syncope and collapse: Secondary | ICD-10-CM

## 2016-07-19 DIAGNOSIS — Z87891 Personal history of nicotine dependence: Secondary | ICD-10-CM | POA: Diagnosis not present

## 2016-07-19 DIAGNOSIS — E875 Hyperkalemia: Secondary | ICD-10-CM

## 2016-07-19 LAB — URINALYSIS, ROUTINE W REFLEX MICROSCOPIC
Bilirubin Urine: NEGATIVE
GLUCOSE, UA: NEGATIVE mg/dL
HGB URINE DIPSTICK: NEGATIVE
KETONES UR: NEGATIVE mg/dL
LEUKOCYTES UA: NEGATIVE
NITRITE: NEGATIVE
PH: 5 (ref 5.0–8.0)
PROTEIN: 30 mg/dL — AB
Specific Gravity, Urine: 1.018 (ref 1.005–1.030)

## 2016-07-19 LAB — BASIC METABOLIC PANEL
ANION GAP: 7 (ref 5–15)
BUN: 11 mg/dL (ref 6–20)
CHLORIDE: 106 mmol/L (ref 101–111)
CO2: 23 mmol/L (ref 22–32)
CREATININE: 1.16 mg/dL — AB (ref 0.44–1.00)
Calcium: 9.2 mg/dL (ref 8.9–10.3)
GFR calc non Af Amer: 57 mL/min — ABNORMAL LOW (ref >60)
Glucose, Bld: 127 mg/dL — ABNORMAL HIGH (ref 65–99)
POTASSIUM: 5.4 mmol/L — AB (ref 3.5–5.1)
SODIUM: 136 mmol/L (ref 135–145)

## 2016-07-19 MED ORDER — ONDANSETRON 4 MG PO TBDP
4.0000 mg | ORAL_TABLET | Freq: Once | ORAL | Status: AC
  Administered 2016-07-19: 4 mg via ORAL
  Filled 2016-07-19: qty 1

## 2016-07-19 MED ORDER — SODIUM CHLORIDE 0.9 % IV BOLUS (SEPSIS): 1000.0000 mL | Freq: Once | INTRAVENOUS | Status: DC

## 2016-07-19 NOTE — ED Triage Notes (Signed)
Per EMS pt onset of dizziness 30 minutes PTA with associated nausea. With EMS arrival pt orthostatic with sitting; 70/40. Pt alert and oriented; denies pain. Pt pale in color.

## 2016-07-19 NOTE — ED Notes (Signed)
This Probation officer attempt IV unsuccessful. Dowd attempt IV unsuccessful but able to collect labs. Plan to give ODT and hydrate with oral fluids.

## 2016-07-19 NOTE — ED Provider Notes (Signed)
Belinda Kaufman   CSN: [id number] Arrival date & time: 07/19/16  1800     History   Chief Complaint Chief Complaint  Patient presents with  . Near Syncope    HPI Belinda Kaufman is a 41 y.o. female.  HPI Patient presents with near-syncope. She states she was sitting eating and began to feel bad. All most passed out. No real fall. She has been fasting this week. Has not been eating from 6 AM to 6 PM. She's been been eating 1 meal at night. States she did have breakfast this morning before 6:00. States she felt somewhat like the wrist things spinning around but also felt like she is given a pass out. No headache. No chest pain. No confusion. Woke up immediately after. No loss of bladder bowel control. Previous episodes like this. She did have an episode of vomiting while she was feeling bad. Initial blood pressure for EMS was hypotensive with a systolic of 90.   Past Medical History:  Diagnosis Date  . Obstruction of fallopian tube    left  . Submucous uterine fibroid     Patient Active Problem List   Diagnosis Date Noted  . Elevated BP 05/02/2015  . De Quervain's tenosynovitis, right 05/02/2015  . Patient desires pregnancy 05/02/2015    Past Surgical History:  Procedure Laterality Date  . CESAREAN SECTION  10/12/2002  . HYSTEROSCOPY N/A 02/12/2016   Procedure: HYSTEROSCOPY WITH SUBMUCOSAL MYOMA RESECTION,HSG;  Surgeon: Governor Specking, MD;  Location: [facility];  Service: Gynecology;  Laterality: N/A;    OB History    Gravida Para Term Preterm AB Living   4 1 1   3 1    SAB TAB Ectopic Multiple Live Births                   Home Medications    Prior to Admission medications   Medication Sig Start Date End Date Taking? Authorizing Provider  acetaminophen (TYLENOL) 500 MG tablet Take 1,000 mg by mouth every 6 (six) hours as needed (cramping).   Yes Historical Provider, MD  ferrous sulfate 325 (65 FE) MG EC tablet Take 325 mg  by mouth daily.   Yes Historical Provider, MD    Family History Family History  Problem Relation Age of Onset  . Hypertension Mother   . Diabetes Father   . Kidney disease Father   . COPD Father   . Kidney disease Son   . Cancer Maternal Grandmother   . Diabetes Paternal Grandmother     Social History Social History  Substance Use Topics  . Smoking status: Former Smoker    Years: 1.00    Types: Cigarettes    Quit date: 02/10/2006  . Smokeless tobacco: Never Used     Comment: social smoker  . Alcohol use 0.0 oz/week     Comment: occasional     Allergies   Shellfish allergy   Review of Systems Review of Systems  Constitutional: Negative for appetite change, fatigue and fever.  HENT: Negative for congestion.   Respiratory: Negative for shortness of breath.   Cardiovascular: Negative for chest pain.  Gastrointestinal: Positive for vomiting.  Endocrine: Negative for polyphagia.  Genitourinary: Negative for difficulty urinating, dyspareunia and pelvic pain.  Musculoskeletal: Negative for back pain.  Skin: Negative for rash.  Neurological: Positive for dizziness and light-headedness.  Hematological: Negative for adenopathy.  Psychiatric/Behavioral: Negative for confusion.     Physical Exam Updated Vital Signs BP (!) 106/51 (BP  Location: Left Arm)   Pulse 77   Temp 98.9 F (37.2 C) (Oral)   Resp 18   Ht 5\' 4"  (1.626 m)   Wt 165 lb (74.8 kg)   LMP 07/12/2016   SpO2 95%   BMI 28.32 kg/m   Physical Exam  Constitutional: She appears well-developed.  HENT:  Head: Atraumatic.  Eyes: EOM are normal.  Neck: Neck supple.  Cardiovascular: Normal rate.   Murmur heard. Patient has a systolic murmur loudest at the left upper parasternal area.  Pulmonary/Chest: Effort normal.  Abdominal: Soft. She exhibits no distension.  Neurological: She is alert.  No nystagmus. Moving all extremities equally.  Skin: Skin is warm. Capillary refill takes less than 2 seconds.      ED Treatments / Results  Labs (all labs ordered are listed, but only abnormal results are displayed) Labs Reviewed  BASIC METABOLIC PANEL - Abnormal; Notable for the following:       Result Value   Potassium 5.4 (*)    Glucose, Bld 127 (*)    Creatinine, Ser 1.16 (*)    GFR calc non Af Amer 57 (*)    All other components within normal limits  URINALYSIS, ROUTINE W REFLEX MICROSCOPIC - Abnormal; Notable for the following:    Color, Urine AMBER (*)    APPearance HAZY (*)    Protein, ur 30 (*)    Bacteria, UA RARE (*)    Squamous Epithelial / LPF 6-30 (*)    All other components within normal limits  I-STAT BETA HCG BLOOD, ED ([facility], [facility], [facility] ONLY)  CBG MONITORING, ED    EKG  EKG Interpretation  Date/Time:  Saturday July 19 2016 18:13:00 EDT Ventricular Rate:  82 PR Interval:    QRS Duration: 74 QT Interval:  333 QTC Calculation: 389 R Axis:   33 Text Interpretation:  Sinus rhythm Borderline T wave abnormalities Confirmed by Alvino Chapel  MD, Ovid Curd [phone]) on 07/19/2016 6:53:15 PM       Radiology Dg Chest 2 View  Result Date: 07/19/2016 CLINICAL DATA:  41 year old female with syncope. EXAM: CHEST  2 VIEW COMPARISON:  None. FINDINGS: The heart size and mediastinal contours are within normal limits. Both lungs are clear. The visualized skeletal structures are unremarkable. IMPRESSION: No active cardiopulmonary disease. Electronically Signed   By: Anner Crete M.D.   On: 07/19/2016 18:50    Procedures Procedures (including critical care time)  Medications Ordered in ED Medications  sodium chloride 0.9 % bolus 1,000 mL (1,000 mLs Intravenous Not Given 07/19/16 1927)  ondansetron (ZOFRAN-ODT) disintegrating tablet 4 mg (4 mg Oral Given 07/19/16 1856)     Initial Impression / Assessment and Plan / ED Course  I have reviewed the triage vital signs and the nursing notes.  Pertinent labs & imaging results that were available during my care of the patient were reviewed  by me and considered in my medical decision making (see chart for details).     Patient with near syncopal episode. Has been fasting. Has not had much oral intake. Labs show slight renal insufficiency. Potassium minimally elevated even possibly erroneous due to difficult lab draw. Patient does not like needles was orally hydrated. Felt better. Eager to be discharged home. Patient does not want a CBC checked for a recheck of potassium. EKG reassuring. Will discharge home.  Final Clinical Impressions(s) / ED Diagnoses   Final diagnoses:  Near syncope  Dehydration  Hyperkalemia    New Prescriptions New Prescriptions   No medications on  file     Davonna Belling, MD 07/19/16 2111

## 2016-07-19 NOTE — ED Notes (Signed)
Pt. REFUSED for IV access and IV Fluid. Pt. Stated that she is tolerating PO fluid intake. Will notify MD.

## 2016-07-19 NOTE — ED Notes (Signed)
Pt did not want CBG taken.  MD is okay without CBG at this time.

## 2016-07-19 NOTE — ED Notes (Signed)
Bed: VT55 Expected date: 07/19/16 Expected time: 5:58 PM Means of arrival: Ambulance Comments: Syncopal episode

## 2018-11-04 ENCOUNTER — Other Ambulatory Visit: Payer: Self-pay | Admitting: Physician Assistant

## 2018-11-04 DIAGNOSIS — Z1231 Encounter for screening mammogram for malignant neoplasm of breast: Secondary | ICD-10-CM

## 2018-12-17 ENCOUNTER — Other Ambulatory Visit: Payer: Self-pay

## 2018-12-17 ENCOUNTER — Ambulatory Visit
Admission: RE | Admit: 2018-12-17 | Discharge: 2018-12-17 | Disposition: A | Discharge status: Home / Self Care | Payer: 59 | Source: Ambulatory Visit | Attending: Physician Assistant | Admitting: Physician Assistant

## 2018-12-17 DIAGNOSIS — Z1231 Encounter for screening mammogram for malignant neoplasm of breast: Secondary | ICD-10-CM

## 2021-04-27 IMAGING — MG DIGITAL SCREENING BILATERAL MAMMOGRAM WITH TOMO AND CAD
8 series · 9 of 24 positions shown · non-contrast
Comparison: None.

CLINICAL DATA: Screening.

EXAM:
DIGITAL SCREENING BILATERAL MAMMOGRAM WITH TOMO AND CAD

[R MLO synth-2D]
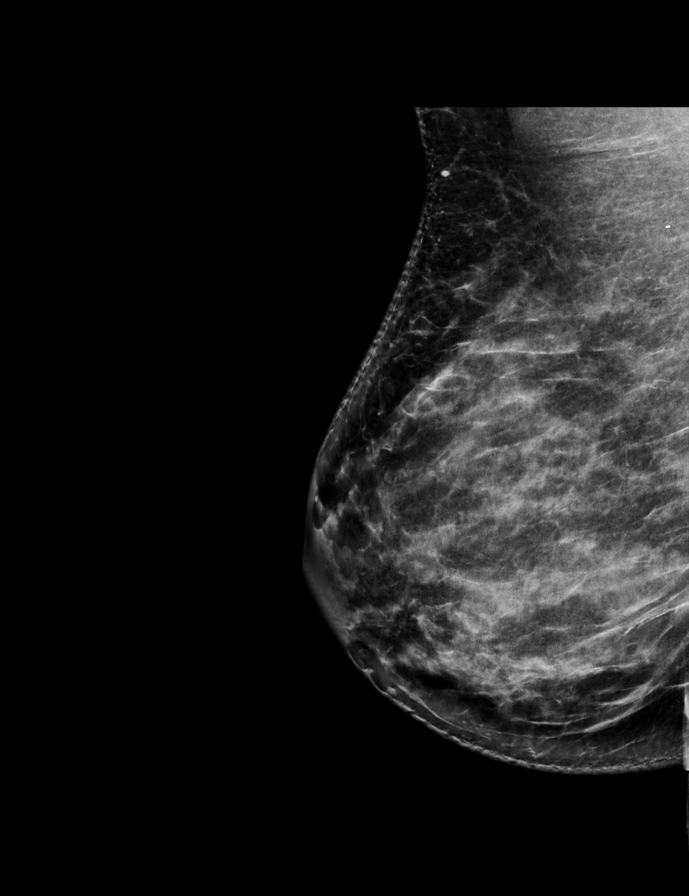

[L CC synth-2D]
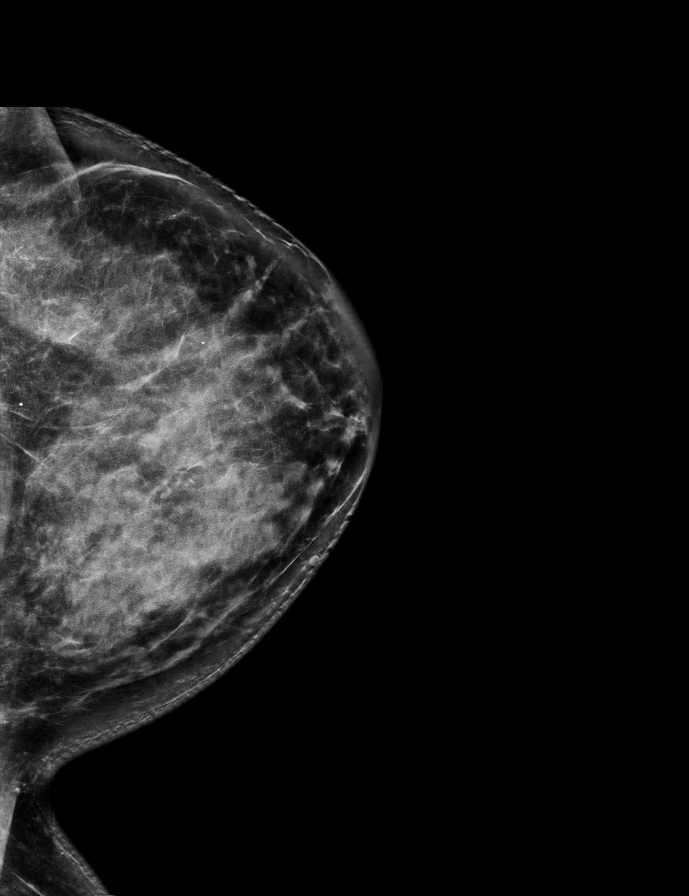

[L MLO synth-2D]
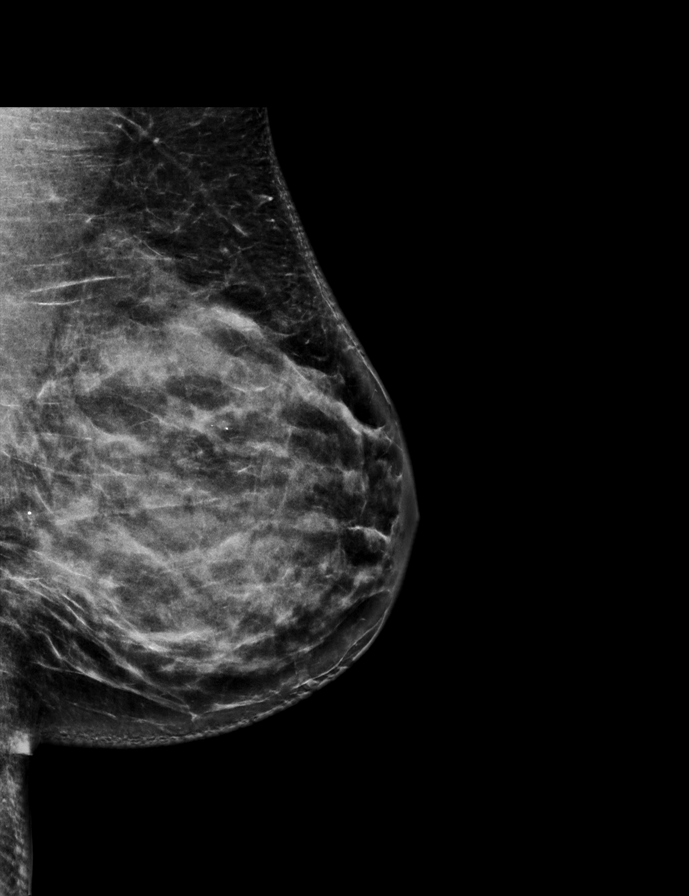

[R CC synth-2D]
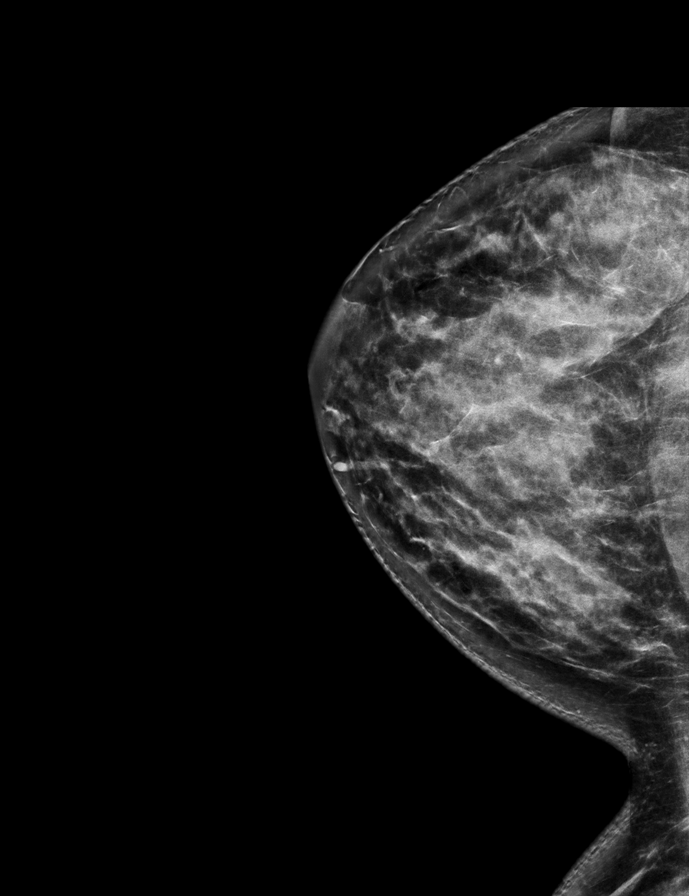

[R MLO tomo · 2 of 74 frames shown]
[frame 24/74]
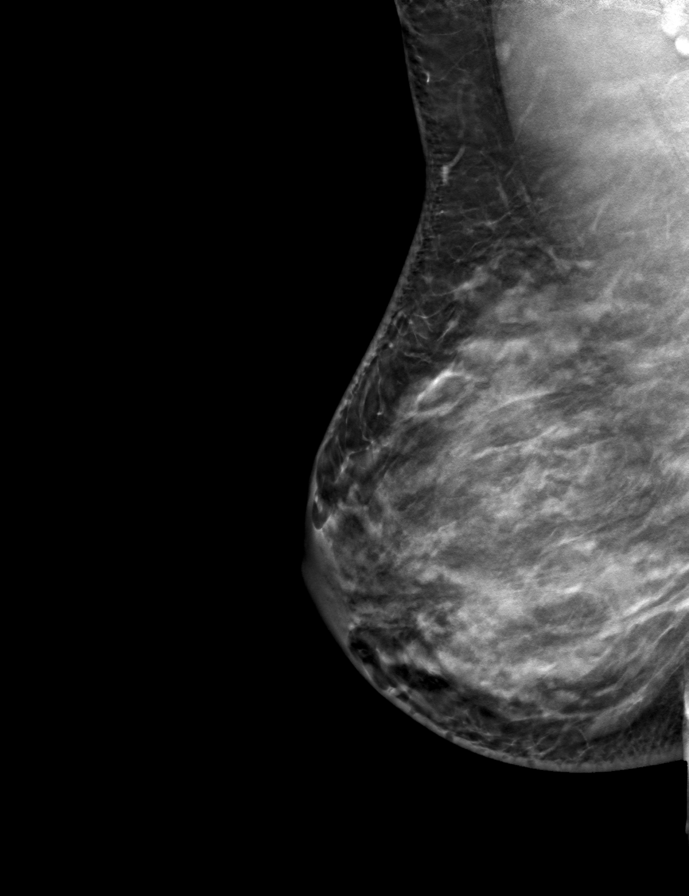
[frame 37/74]
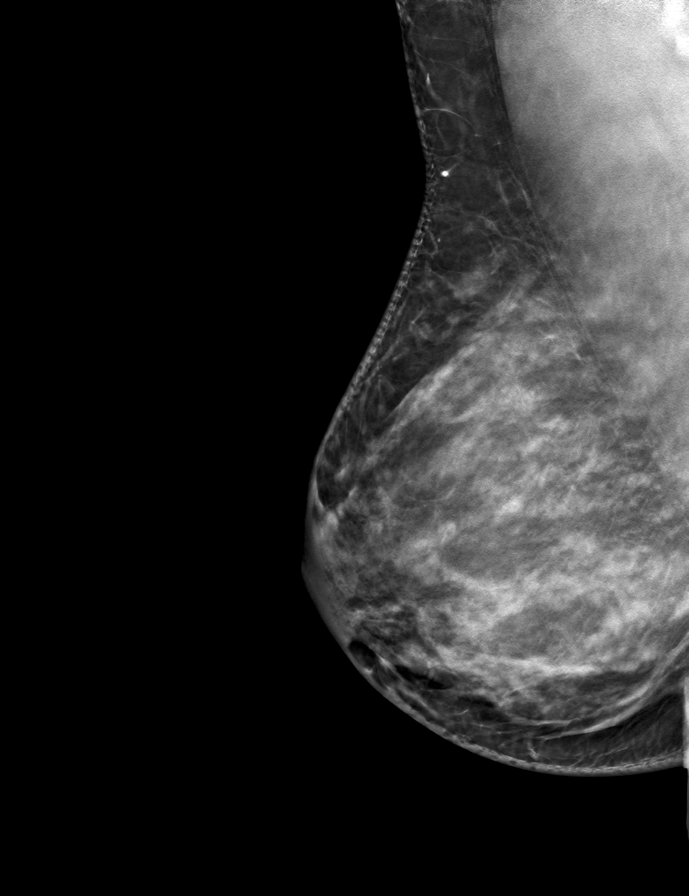

[L MLO tomo · tomo slice 37/73.0]
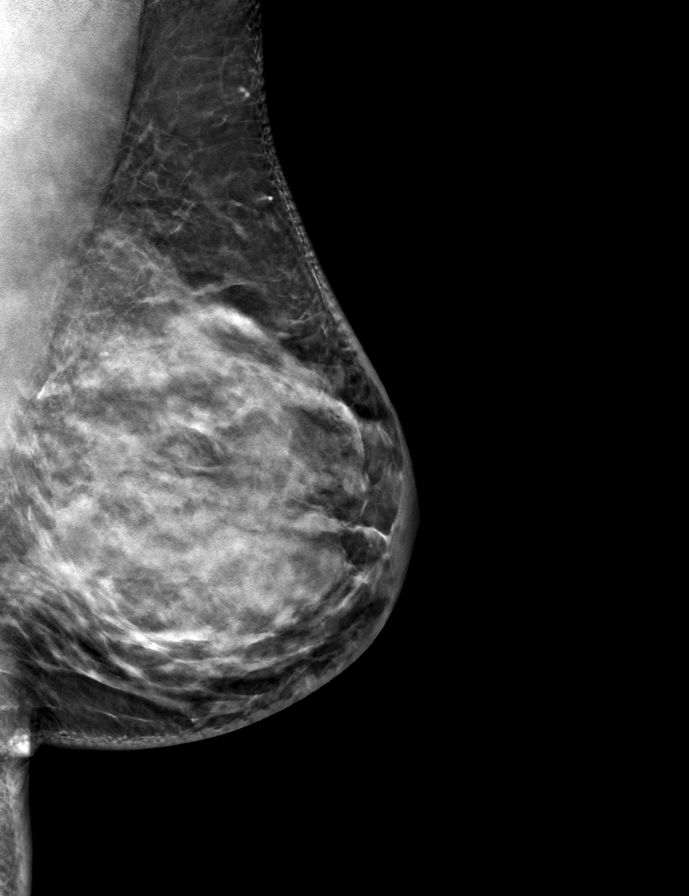

[R CC tomo · tomo slice 39/76.0]
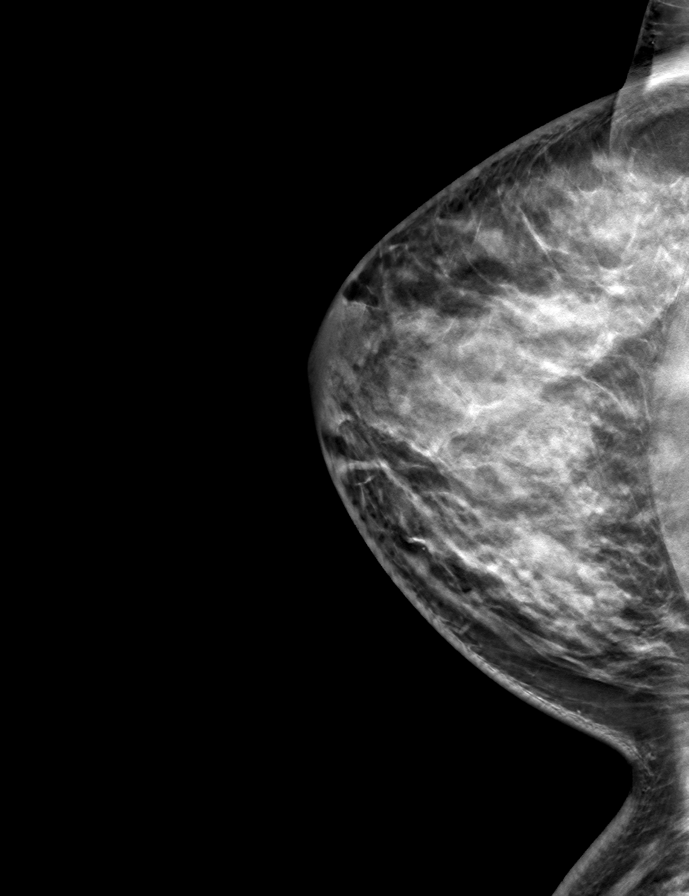

[L CC tomo · tomo slice 38/75.0]
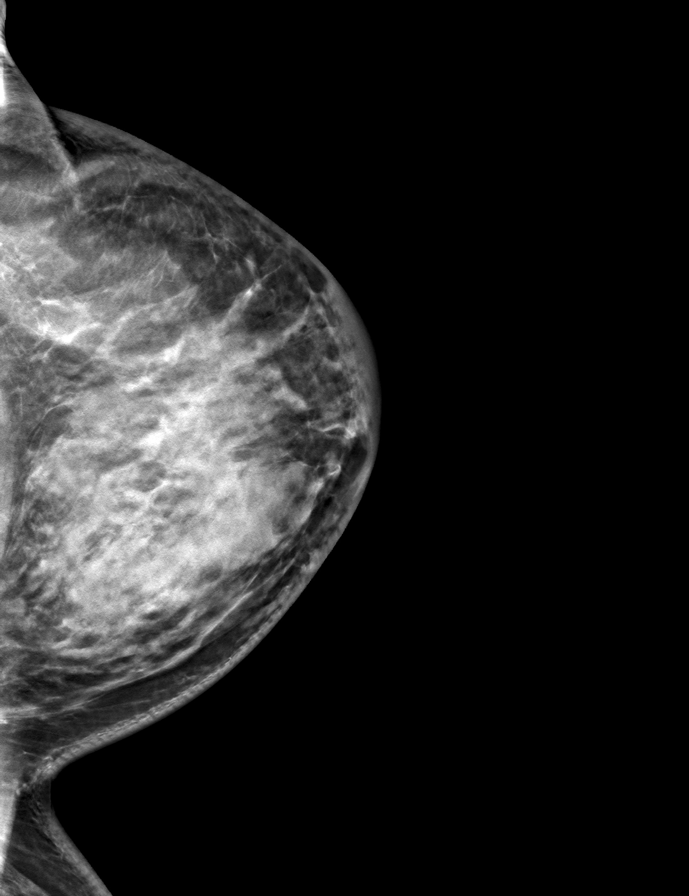

[9 of 24 positions shown; findings below may reference images not displayed]

ACR Breast Density Category c: The breast tissue is heterogeneously
dense, which may obscure small masses
FINDINGS: There are no findings suspicious for malignancy. Images were
processed with CAD.
IMPRESSION: No mammographic evidence of malignancy. A result letter of this
screening mammogram will be mailed directly to the patient.

RECOMMENDATION:
Screening mammogram in one year. (Code:EM-2-IHY)

BI-RADS CATEGORY  1: Negative.
